# Patient Record
Sex: Female | Born: 1935 | Race: White | Hispanic: No
Health system: Southern US, Community
[De-identification: ages and names within clinical notes are randomized; demographics above are authoritative.]

---

## 2018-06-05 ENCOUNTER — Other Ambulatory Visit: Payer: Self-pay

## 2018-06-05 ENCOUNTER — Emergency Department: Payer: Medicare Other

## 2018-06-05 ENCOUNTER — Emergency Department
Admission: EM | Admit: 2018-06-05 | Discharge: 2018-06-05 | Disposition: A | Discharge status: Home / Self Care | Payer: Medicare Other | Attending: Emergency Medicine | Admitting: Emergency Medicine

## 2018-06-05 DIAGNOSIS — E119 Type 2 diabetes mellitus without complications: Secondary | ICD-10-CM | POA: Diagnosis not present

## 2018-06-05 DIAGNOSIS — Y9289 Other specified places as the place of occurrence of the external cause: Secondary | ICD-10-CM | POA: Diagnosis not present

## 2018-06-05 DIAGNOSIS — S0003XA Contusion of scalp, initial encounter: Secondary | ICD-10-CM | POA: Diagnosis not present

## 2018-06-05 DIAGNOSIS — Z79899 Other long term (current) drug therapy: Secondary | ICD-10-CM | POA: Diagnosis not present

## 2018-06-05 DIAGNOSIS — Y9389 Activity, other specified: Secondary | ICD-10-CM | POA: Insufficient documentation

## 2018-06-05 DIAGNOSIS — Z7902 Long term (current) use of antithrombotics/antiplatelets: Secondary | ICD-10-CM | POA: Insufficient documentation

## 2018-06-05 DIAGNOSIS — I10 Essential (primary) hypertension: Secondary | ICD-10-CM | POA: Diagnosis not present

## 2018-06-05 DIAGNOSIS — Y999 Unspecified external cause status: Secondary | ICD-10-CM | POA: Diagnosis not present

## 2018-06-05 DIAGNOSIS — E039 Hypothyroidism, unspecified: Secondary | ICD-10-CM | POA: Insufficient documentation

## 2018-06-05 DIAGNOSIS — Z7982 Long term (current) use of aspirin: Secondary | ICD-10-CM | POA: Diagnosis not present

## 2018-06-05 DIAGNOSIS — S0990XA Unspecified injury of head, initial encounter: Secondary | ICD-10-CM

## 2018-06-05 DIAGNOSIS — W01198A Fall on same level from slipping, tripping and stumbling with subsequent striking against other object, initial encounter: Secondary | ICD-10-CM | POA: Diagnosis not present

## 2018-06-05 DIAGNOSIS — W19XXXA Unspecified fall, initial encounter: Secondary | ICD-10-CM

## 2018-06-05 MED ORDER — ACETAMINOPHEN 500 MG PO TABS
1000.0000 mg | ORAL_TABLET | Freq: Once | ORAL | Status: AC
  Administered 2018-06-05: 1000 mg via ORAL
  Filled 2018-06-05: qty 2

## 2018-06-05 NOTE — ED Triage Notes (Signed)
Pt arrives Via [facility] from home  fell and hit and head. 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 L/S Masectomy A/Ox4 BP 169/73 77 Pace Rythmn 99 RA

## 2018-06-05 NOTE — ED Provider Notes (Signed)
[facility] Emergency Department Provider Note  Time seen: 2:00 PM  I have reviewed the triage vital signs and the nursing notes.   HISTORY  Chief Complaint Fall    HPI Alyssa Palmer is a 84 y.o. female with a past medical history of hypertension, diabetes, presents to the emergency department after a fall.  According to the patient she was attempting to step up a incline and fell backwards hitting the back of her head.  Denies any loss of consciousness.  Patient was able to call 911 for help who brought her to the emergency department for evaluation.  Patient denies any symptoms at this time besides mild pain in the back of her head.  Denies any extremity pain or back pain.  No neck pain.  Largely negative review of systems.  Past medical history: Hypertension Diabetes Hypothyroidism  Medications: 81 mg aspirin Metoprolol Levothyroxine Amlodipine Clopidogrel Spironolactone Gabapentin Melatonin  Allergies: Ciprofloxacin Lisinopril Statins Tetracycline Codeine  Social History No smoking  Review of Systems Constitutional: Negative for loss of consciousness Eyes: Negative for visual complaints ENT: Negative for recent illness/congestion Cardiovascular: Negative for chest pain. Respiratory: Negative for shortness of breath. Gastrointestinal: Negative for abdominal pain, vomiting  Musculoskeletal: Negative for neck back or extremity pain Skin: Hematoma to back of head Neurological: Mild occipital scalp pain/headache All other ROS negative  ____________________________________________   PHYSICAL EXAM:  VITAL SIGNS: ED Triage Vitals  Enc Vitals Group     BP 06/05/18 1347 (!) 166/65     Pulse Rate 06/05/18 1347 66     Resp 06/05/18 1347 12     Temp 06/05/18 1347 98.1 F (36.7 C)     Temp Source 06/05/18 1347 Oral     SpO2 06/05/18 1347 100 %     Weight 06/05/18 1341 185 lb (83.9 kg)     Height 06/05/18 1341 5\' 3"  (1.6 m)      Head Circumference --      Peak Flow --      Pain Score 06/05/18 1341 0     Pain Loc --      Pain Edu? --      Excl. in GC? --    Constitutional: Alert and oriented. Well appearing and in no distress. Eyes: Normal exam ENT   Head: Moderate hematoma to occipital scalp, no abrasion or laceration.   Mouth/Throat: Mucous membranes are moist. Cardiovascular: Normal rate, regular rhythm.  Respiratory: Normal respiratory effort without tachypnea nor retractions. Breath sounds are clear  Gastrointestinal: Soft and nontender. No distention.   Musculoskeletal: Nontender with normal range of motion in all extremities Neurologic:  Normal speech and language. No gross focal neurologic deficits  Skin:  Skin is warm, dry and intact.  Psychiatric: Mood and affect are normal.  ____________________________________________    EKG  EKG reviewed and interpreted by myself shows an atrial ventricular paced rhythm at 70 bpm with a widened QRS, nonspecific ST changes.  ____________________________________________    RADIOLOGY  CT shows soft tissue swelling without acute abnormality.  ____________________________________________   INITIAL IMPRESSION / ASSESSMENT AND PLAN / ED COURSE  Pertinent labs & imaging results that were available during my care of the patient were reviewed by me and considered in my medical decision making (see chart for details).  Patient presents to the emergency department after a fall hitting the back of her head.  No LOC.  No extremity pain neck or back pain.  Overall the patient appears very well besides a moderate  hematoma to occipital scalp.  We will obtain a CT scan of the head as a precaution.  Patient agreeable to plan of care.  CT is negative will attempt to ambulate the patient as long as the patient is able to ambulate continues to appear well anticipate likely discharge home.  CT scan is negative.  We will discharge patient home with PCP  follow-up. ____________________________________________   FINAL CLINICAL IMPRESSION(S) / ED DIAGNOSES  Fall Hematoma    Minna Antis, MD 06/05/18 1511

## 2018-06-05 NOTE — ED Notes (Signed)
Pt was given phone to call for possible ride upon d/c. Pt was unable to reach anyone and will re-try.

## 2019-06-12 IMAGING — CT CT HEAD W/O CM
4 series · 16 of 47 positions shown, 18 images · non-contrast
Comparison: None.

CLINICAL DATA: Post fall hitting back of head

EXAM:
CT HEAD WITHOUT CONTRAST
TECHNIQUE: Contiguous axial images were obtained from the base of the skull
through the vertex without intravenous contrast.

[Series 2: head wo · axial · 0.47mm/px · z∈[-136,-36]mm · 7 of 28 slices shown, 9 images]
[im 4/28  brain]
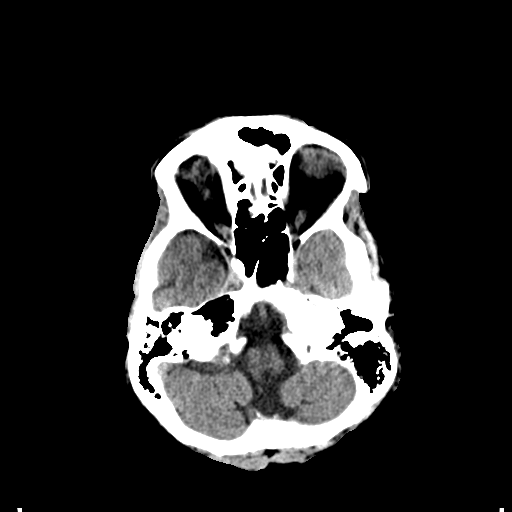
[im 4/28  bone]
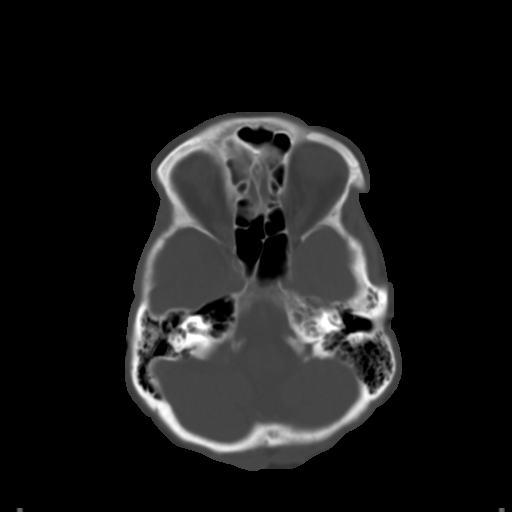
[im 7/28  brain]
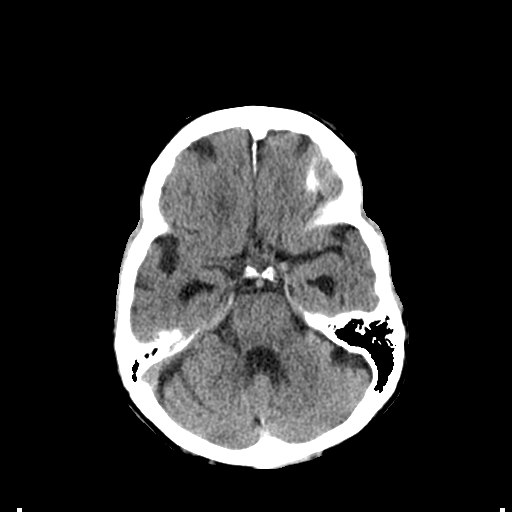
[im 11/28  brain]
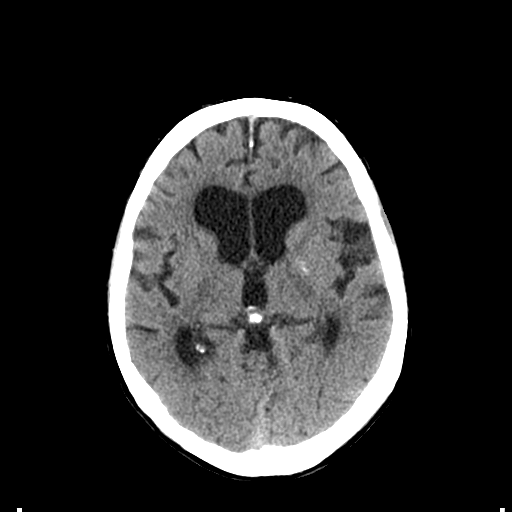
[im 14/28  brain]
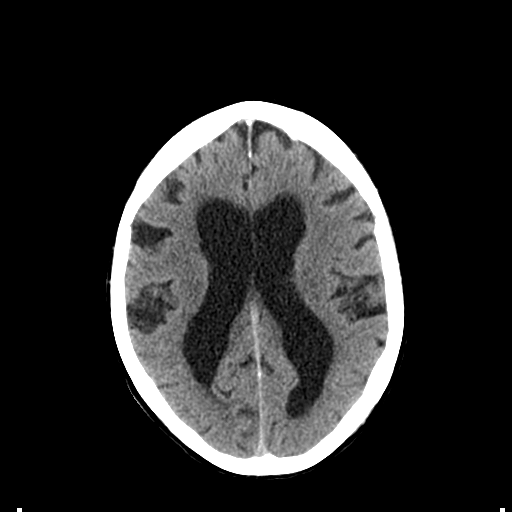
[im 17/28  brain]
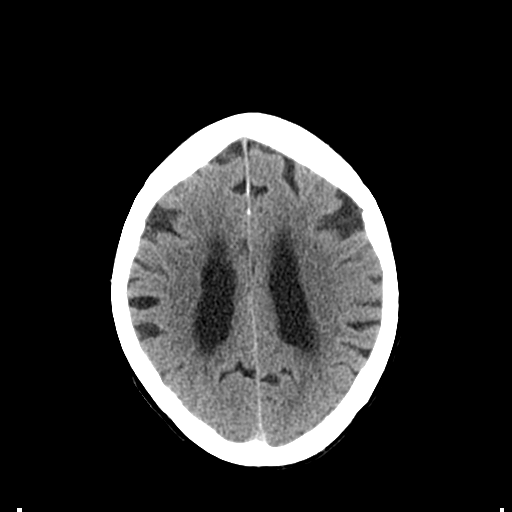
[im 17/28  bone]
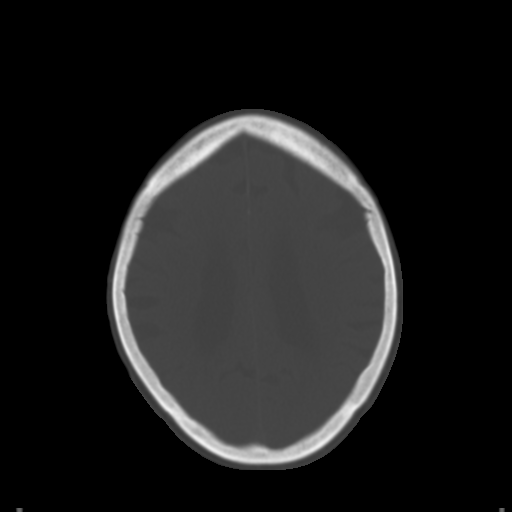
[im 21/28  brain]
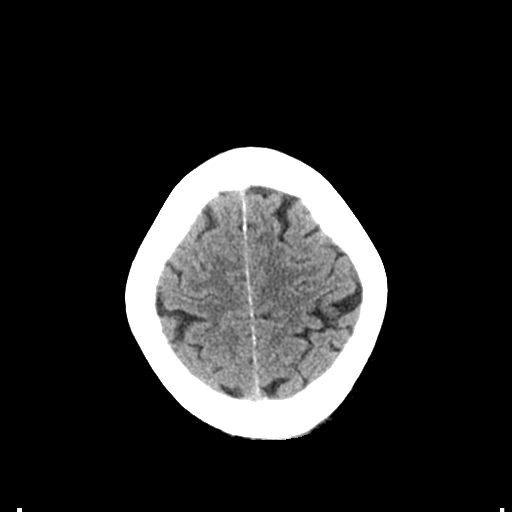
[im 24/28  brain]
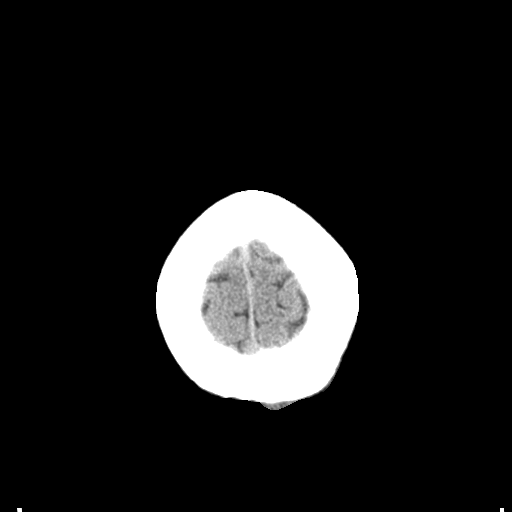

[Series 3: head bone · axial · 0.47mm/px · z∈[-139,-111]mm · 3 of 70 slices shown]
[im 7/70  bone]
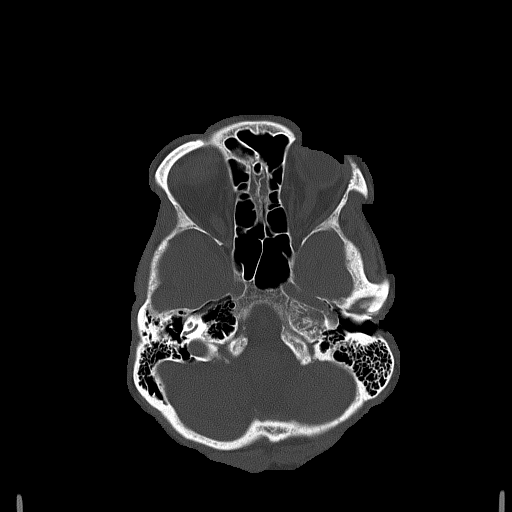
[im 14/70  bone]
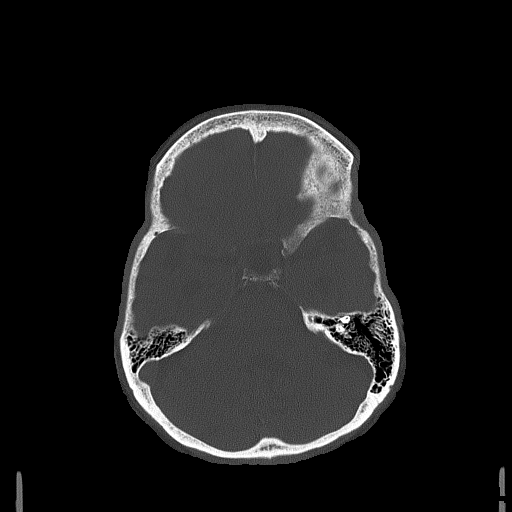
[im 21/70  bone]
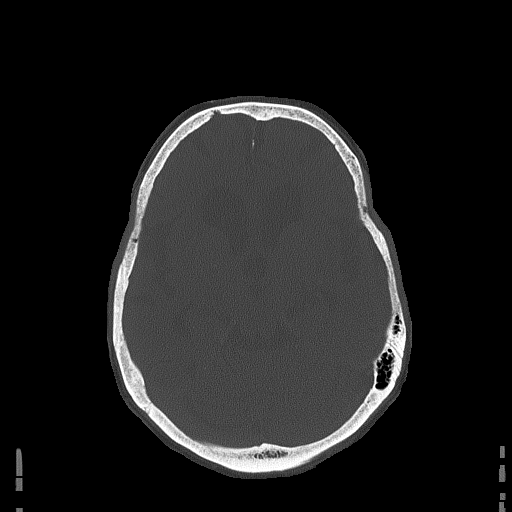

[Series 4: coronal soft tissue · coronal · 0.28mm/px · 3 of 65 slices shown]
[im 22/65  brain]
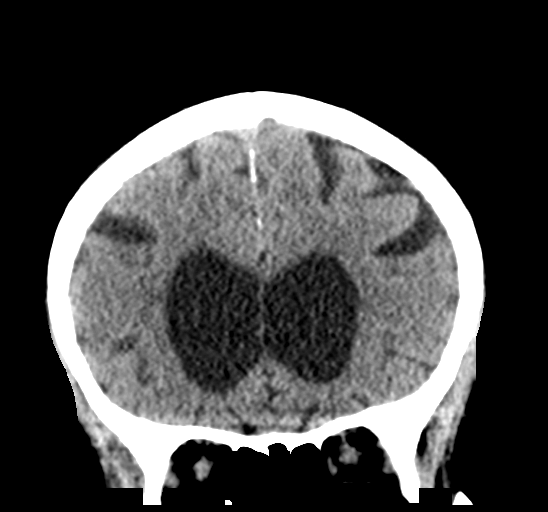
[im 29/65  brain]
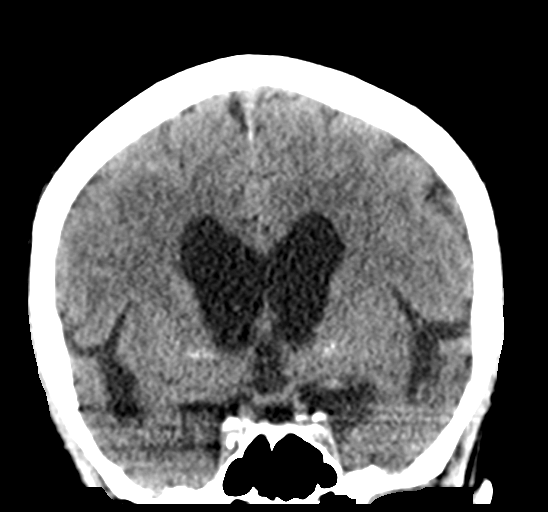
[im 36/65  brain]
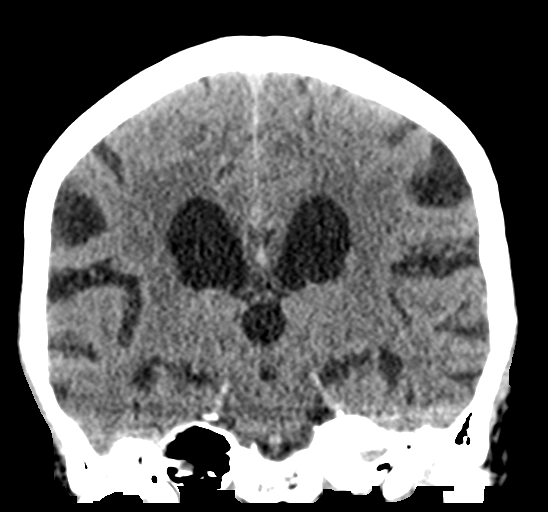

[Series 5: sagittal soft tissue · sagittal · 0.28mm/px · 3 of 49 slices shown]
[im 17/49  brain]
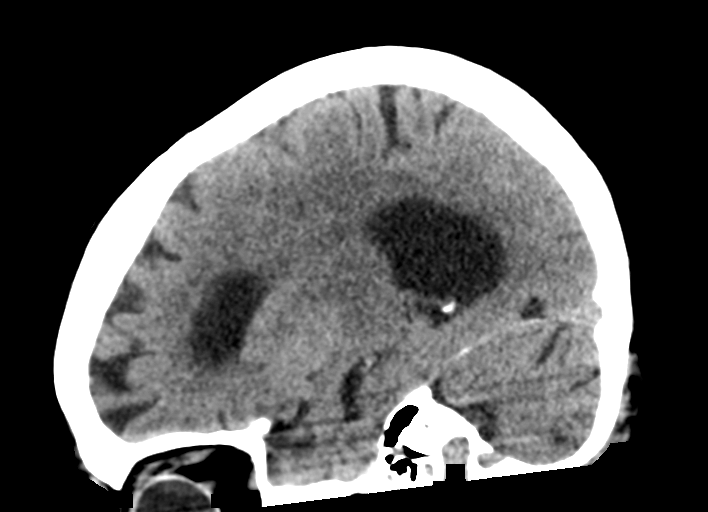
[im 25/49  brain]
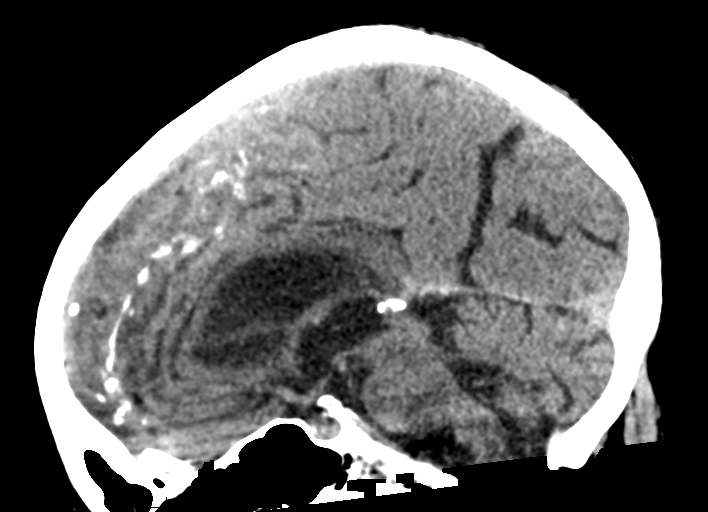
[im 33/49  brain]
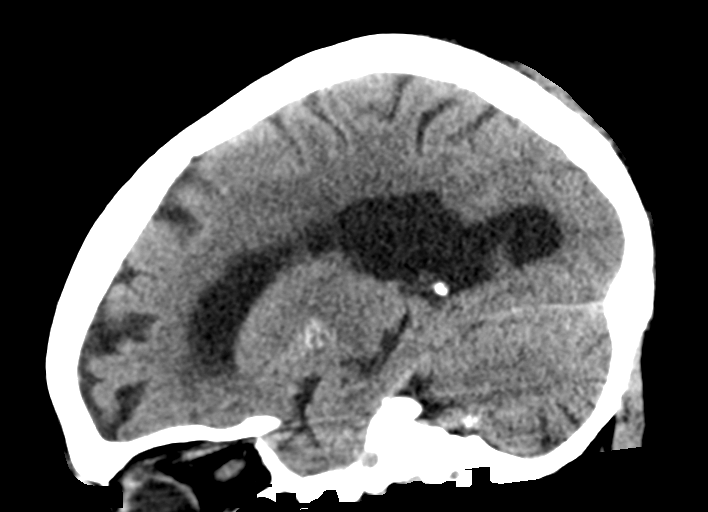

[16 of 47 positions shown; findings below may reference images not displayed]

FINDINGS: Brain: Advanced atrophy with sulcal prominence centralized volume
loss with commensurate ex vacuo dilatation of ventricular system.
Scattered periventricular hypodensities compatible with
microvascular ischemic disease. Bilateral basal ganglial
calcifications. Given background parenchymal abnormalities, there is
no CT evidence superimposed acute large territory infarct. No
intraparenchymal or extra-axial mass or hemorrhage. Normal
configuration the ventricles and basilar cisterns. No midline shift.

Vascular: Intracranial atherosclerosis.

Skull: No displaced calvarial fracture with special attention paid
to the left-sided the occiput.

Sinuses/Orbits: There is underpneumatization the bilateral frontal
sinuses. The remaining paranasal sinuses and mastoid air cells are
well aerated. No air-fluid levels.

Other: There is a minimal amount of soft tissue stranding about the
left side the occiput (image 8, series 2) without associated
radiopaque foreign body or displaced calvarial fracture.
IMPRESSION: 1. Minimal amount of soft tissue swelling about the left side of the
occiput without associated displaced calvarial fracture radiopaque
body or acute intracranial process.
2. Advanced atrophy and microvascular ischemic disease without
superimposed acute intracranial process.
